# Patient Record
Sex: Male | Born: 2000
Health system: Southern US, Community
[De-identification: ages and names within clinical notes are randomized; demographics above are authoritative.]

## PROBLEM LIST (undated history)

## (undated) DIAGNOSIS — E039 Hypothyroidism, unspecified: Secondary | ICD-10-CM

---
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
## 2018-01-07 DIAGNOSIS — E039 Hypothyroidism, unspecified: Secondary | ICD-10-CM | POA: Diagnosis not present

## 2018-01-07 DIAGNOSIS — Z6834 Body mass index (BMI) 34.0-34.9, adult: Secondary | ICD-10-CM | POA: Diagnosis not present

## 2018-01-07 DIAGNOSIS — E663 Overweight: Secondary | ICD-10-CM | POA: Diagnosis not present

## 2018-01-07 DIAGNOSIS — R739 Hyperglycemia, unspecified: Secondary | ICD-10-CM | POA: Diagnosis not present

## 2018-03-01 DIAGNOSIS — B354 Tinea corporis: Secondary | ICD-10-CM | POA: Diagnosis not present

## 2018-03-15 DIAGNOSIS — B354 Tinea corporis: Secondary | ICD-10-CM | POA: Diagnosis not present

## 2018-03-15 DIAGNOSIS — Z6834 Body mass index (BMI) 34.0-34.9, adult: Secondary | ICD-10-CM | POA: Diagnosis not present

## 2018-04-11 DIAGNOSIS — E039 Hypothyroidism, unspecified: Secondary | ICD-10-CM | POA: Diagnosis not present

## 2018-04-19 DIAGNOSIS — Z6837 Body mass index (BMI) 37.0-37.9, adult: Secondary | ICD-10-CM | POA: Diagnosis not present

## 2018-04-19 DIAGNOSIS — E039 Hypothyroidism, unspecified: Secondary | ICD-10-CM | POA: Diagnosis not present

## 2018-08-16 DIAGNOSIS — H04123 Dry eye syndrome of bilateral lacrimal glands: Secondary | ICD-10-CM | POA: Diagnosis not present

## 2018-08-16 DIAGNOSIS — G245 Blepharospasm: Secondary | ICD-10-CM | POA: Diagnosis not present

## 2018-09-13 DIAGNOSIS — H04123 Dry eye syndrome of bilateral lacrimal glands: Secondary | ICD-10-CM | POA: Diagnosis not present

## 2018-09-13 DIAGNOSIS — G245 Blepharospasm: Secondary | ICD-10-CM | POA: Diagnosis not present

## 2018-11-13 DIAGNOSIS — L03031 Cellulitis of right toe: Secondary | ICD-10-CM | POA: Diagnosis not present

## 2018-11-13 DIAGNOSIS — L03032 Cellulitis of left toe: Secondary | ICD-10-CM | POA: Diagnosis not present

## 2018-11-13 DIAGNOSIS — S99921A Unspecified injury of right foot, initial encounter: Secondary | ICD-10-CM | POA: Diagnosis not present

## 2018-11-29 DIAGNOSIS — Z0000 Encounter for general adult medical examination without abnormal findings: Secondary | ICD-10-CM | POA: Diagnosis not present

## 2018-12-03 DIAGNOSIS — Z6837 Body mass index (BMI) 37.0-37.9, adult: Secondary | ICD-10-CM | POA: Diagnosis not present

## 2018-12-03 DIAGNOSIS — Z23 Encounter for immunization: Secondary | ICD-10-CM | POA: Diagnosis not present

## 2018-12-03 DIAGNOSIS — Z0000 Encounter for general adult medical examination without abnormal findings: Secondary | ICD-10-CM | POA: Diagnosis not present

## 2018-12-09 DIAGNOSIS — H5203 Hypermetropia, bilateral: Secondary | ICD-10-CM | POA: Diagnosis not present

## 2018-12-09 DIAGNOSIS — H04123 Dry eye syndrome of bilateral lacrimal glands: Secondary | ICD-10-CM | POA: Diagnosis not present

## 2018-12-09 DIAGNOSIS — G245 Blepharospasm: Secondary | ICD-10-CM | POA: Diagnosis not present

## 2019-01-13 DIAGNOSIS — F4010 Social phobia, unspecified: Secondary | ICD-10-CM | POA: Diagnosis not present

## 2019-02-10 ENCOUNTER — Other Ambulatory Visit: Payer: Self-pay

## 2019-02-10 DIAGNOSIS — E039 Hypothyroidism, unspecified: Secondary | ICD-10-CM | POA: Diagnosis not present

## 2019-02-10 DIAGNOSIS — Z20822 Contact with and (suspected) exposure to covid-19: Secondary | ICD-10-CM

## 2019-02-10 DIAGNOSIS — Z20828 Contact with and (suspected) exposure to other viral communicable diseases: Secondary | ICD-10-CM | POA: Diagnosis not present

## 2019-02-10 DIAGNOSIS — R509 Fever, unspecified: Secondary | ICD-10-CM | POA: Diagnosis not present

## 2019-02-12 DIAGNOSIS — R509 Fever, unspecified: Secondary | ICD-10-CM | POA: Diagnosis not present

## 2019-02-12 DIAGNOSIS — Z20828 Contact with and (suspected) exposure to other viral communicable diseases: Secondary | ICD-10-CM | POA: Diagnosis not present

## 2019-02-12 LAB — NOVEL CORONAVIRUS, NAA: SARS-CoV-2, NAA: NOT DETECTED

## 2019-03-24 DIAGNOSIS — Z719 Counseling, unspecified: Secondary | ICD-10-CM | POA: Diagnosis not present

## 2019-03-24 DIAGNOSIS — E039 Hypothyroidism, unspecified: Secondary | ICD-10-CM | POA: Diagnosis not present

## 2019-03-24 DIAGNOSIS — Z828 Family history of other disabilities and chronic diseases leading to disablement, not elsewhere classified: Secondary | ICD-10-CM | POA: Diagnosis not present

## 2019-04-01 DIAGNOSIS — J0300 Acute streptococcal tonsillitis, unspecified: Secondary | ICD-10-CM | POA: Diagnosis not present

## 2019-06-03 DIAGNOSIS — S0501XA Injury of conjunctiva and corneal abrasion without foreign body, right eye, initial encounter: Secondary | ICD-10-CM | POA: Diagnosis not present

## 2019-07-18 DIAGNOSIS — K080 Exfoliation of teeth due to systemic causes: Secondary | ICD-10-CM | POA: Diagnosis not present

## 2020-01-18 ENCOUNTER — Other Ambulatory Visit: Payer: Self-pay

## 2020-01-18 ENCOUNTER — Ambulatory Visit
Admission: EM | Admit: 2020-01-18 | Discharge: 2020-01-18 | Disposition: A | Discharge status: Home / Self Care | Payer: Self-pay

## 2020-01-18 DIAGNOSIS — L03011 Cellulitis of right finger: Secondary | ICD-10-CM

## 2020-01-18 MED ORDER — CEPHALEXIN 500 MG PO CAPS: 500.0000 mg | ORAL_CAPSULE | Freq: Four times a day (QID) | ORAL | 0 refills | Status: DC

## 2020-01-18 NOTE — Discharge Instructions (Signed)
Keflex as directed. Warm compresses. Keep wound clean and dry. Monitor for spreading redness, continue pus like drainage, fever, follow up for reevaluation.

## 2020-01-18 NOTE — ED Provider Notes (Signed)
[facility]    CSN: [id number] Arrival date & time: 01/18/20  1039      History   Chief Complaint Chief Complaint  Patient presents with   Hand Pain    right ring finger x 1 week    HPI Glenn Thompson is a 19 y.o. male.   19 year old male comes in for 1 week history of right ring finger pain, swelling, redness. States few days after this started, had purulent drainage out of the corner of nailbed. Since then has been applying otc medicine without relief. Continued swelling and pain and therefore came in for evaluation. Denies spreading erythema, fever.      History reviewed. No pertinent past medical history.  There are no problems to display for this patient.   History reviewed. No pertinent surgical history.     Home Medications    Prior to Admission medications   Medication Sig Start Date End Date Taking? Authorizing Provider  levothyroxine (SYNTHROID) 100 MCG tablet Take 50 mcg by mouth daily before breakfast.   Yes [provider]  cephALEXin (KEFLEX) 500 MG capsule Take 1 capsule (500 mg total) by mouth 4 (four) times daily. 01/18/20   Cathie Hoops, Dyami Umbach V, PA-C  dicyclomine (BENTYL) 20 MG tablet Take 1 tablet (20 mg total) by mouth 4 (four) times daily -  before meals and at bedtime. For 2 days 02/11/14 01/18/20  Truddie Coco, DO    Family History History reviewed. No pertinent family history.  Social History Social History   Tobacco Use   Smoking status: Never Smoker   Smokeless tobacco: Never Used  Building services engineer Use: Never used  Substance Use Topics   Alcohol use: Yes    Comment: socially   Drug use: Never     Allergies   Patient has no known allergies.   Review of Systems Review of Systems  Reason unable to perform ROS: See HPI as above.     Physical Exam Triage Vital Signs ED Triage Vitals  Enc Vitals Group     BP 01/18/20 1120 126/85     Pulse Rate 01/18/20 1120 95     Resp 01/18/20 1120 18      Temp 01/18/20 1120 98 F (36.7 C)     Temp Source 01/18/20 1120 Oral     SpO2 01/18/20 1120 98 %     Weight --      Height --      Head Circumference --      Peak Flow --      Pain Score 01/18/20 1122 4     Pain Loc --      Pain Edu? --      Excl. in GC? --    No data found.  Updated Vital Signs BP 126/85 (BP Location: Left Arm)    Pulse 95    Temp 98 F (36.7 C) (Oral)    Resp 18    SpO2 98%   Physical Exam Constitutional:      General: He is not in acute distress.    Appearance: Normal appearance. He is well-developed. He is not toxic-appearing or diaphoretic.  HENT:     Head: Normocephalic and atraumatic.  Eyes:     Conjunctiva/sclera: Conjunctivae normal.     Pupils: Pupils are equal, round, and reactive to light.  Pulmonary:     Effort: Pulmonary effort is normal. No respiratory distress.  Musculoskeletal:     Cervical back: Normal range of motion  and neck supple.     Comments: Right ring finger with crusting and swelling to the lateral nailbed. No active drainage. Surrounding erythema. Area tender to palpation. No obvious fluctuance felt. No finger pad tenderness. Full ROM of finger. NVI  Skin:    General: Skin is warm and dry.  Neurological:     Mental Status: He is alert and oriented to person, place, and time.      UC Treatments / Results  Labs (all labs ordered are listed, but only abnormal results are displayed) Labs Reviewed - No data to display  EKG   Radiology No results found.  Procedures Procedures (including critical care time)  Medications Ordered in UC Medications - No data to display  Initial Impression / Assessment and Plan / UC Course  I have reviewed the triage vital signs and the nursing notes.  Pertinent labs & imaging results that were available during my care of the patient were reviewed by me and considered in my medical decision making (see chart for details).    Keflex as directed. Wound care instructions given. Return  precautions given.  Final Clinical Impressions(s) / UC Diagnoses   Final diagnoses:  Paronychia of right ring finger   ED Prescriptions    Medication Sig Dispense Auth. Provider   cephALEXin (KEFLEX) 500 MG capsule Take 1 capsule (500 mg total) by mouth 4 (four) times daily. 28 capsule Belinda Fisher, PA-C     PDMP not reviewed this encounter.   Belinda Fisher, PA-C 01/18/20 1157

## 2020-01-18 NOTE — ED Triage Notes (Signed)
Pt states he bites his nails and the right ring finger became infected and painful approximately 1 week ago. Pt is aox4 and ambulatory.

## 2020-02-19 DIAGNOSIS — H04123 Dry eye syndrome of bilateral lacrimal glands: Secondary | ICD-10-CM | POA: Diagnosis not present

## 2020-02-19 DIAGNOSIS — S0501XS Injury of conjunctiva and corneal abrasion without foreign body, right eye, sequela: Secondary | ICD-10-CM | POA: Diagnosis not present

## 2020-02-19 DIAGNOSIS — G245 Blepharospasm: Secondary | ICD-10-CM | POA: Diagnosis not present

## 2020-10-07 DIAGNOSIS — E039 Hypothyroidism, unspecified: Secondary | ICD-10-CM | POA: Diagnosis not present

## 2020-10-07 DIAGNOSIS — R5383 Other fatigue: Secondary | ICD-10-CM | POA: Diagnosis not present

## 2020-10-17 ENCOUNTER — Other Ambulatory Visit: Payer: Self-pay

## 2020-10-17 ENCOUNTER — Ambulatory Visit
Admission: RE | Admit: 2020-10-17 | Discharge: 2020-10-17 | Disposition: A | Discharge status: Home / Self Care | Payer: Self-pay | Source: Ambulatory Visit | Attending: Physician Assistant | Admitting: Physician Assistant

## 2020-10-17 VITALS — BP 122/82 | HR 89 | Temp 98.1°F | Resp 18

## 2020-10-17 DIAGNOSIS — S0501XA Injury of conjunctiva and corneal abrasion without foreign body, right eye, initial encounter: Secondary | ICD-10-CM

## 2020-10-17 HISTORY — DX: Hypothyroidism, unspecified: E03.9

## 2020-10-17 MED ORDER — ERYTHROMYCIN 5 MG/GM OP OINT: TOPICAL_OINTMENT | OPHTHALMIC | 0 refills | Status: DC

## 2020-10-17 NOTE — ED Provider Notes (Signed)
[facility]    CSN: [id number] Arrival date & time: 10/17/20  1146      History   Chief Complaint Chief Complaint  Patient presents with   right eye injury    HPI Glenn Thompson is a 20 y.o. male.   Patient presents today with several day history of right eye irritation.  Reports that his cat scratched him in his right eye and he has had drainage and irritation since that time.  He does report blurred vision but believes this is because of irritation and reports that he is able to see light and other objects but does not read fine print.  He reports an episode of similar symptoms in the past at which point he was prescribed antibiotic ointment and resolved.  He has not seen his ophthalmologist recently but can schedule an appointment.  He denies any headache, nausea, vomiting, ocular pain.  He does not wear contacts but does wear glasses.   Past Medical History:  Diagnosis Date   Hypothyroidism     There are no problems to display for this patient.   History reviewed. No pertinent surgical history.     Home Medications    Prior to Admission medications   Medication Sig Start Date End Date Taking? Authorizing Provider  erythromycin ophthalmic ointment Place a 1/2 inch ribbon of ointment into the lower eyelid. 10/17/20  Yes [place] Frieden, Noberto Retort, PA-C  levothyroxine (SYNTHROID) 100 MCG tablet Take 50 mcg by mouth daily before breakfast.    [provider]  dicyclomine (BENTYL) 20 MG tablet Take 1 tablet (20 mg total) by mouth 4 (four) times daily -  before meals and at bedtime. For 2 days 02/11/14 01/18/20  Truddie Coco, DO    Family History History reviewed. No pertinent family history.  Social History Social History   Tobacco Use   Smoking status: Never   Smokeless tobacco: Never  Vaping Use   Vaping Use: Never used  Substance Use Topics   Alcohol use: Yes    Comment: socially   Drug use: Never     Allergies   Patient has no known  allergies.   Review of Systems Review of Systems  Constitutional:  Negative for activity change, appetite change, fatigue and fever.  Eyes:  Positive for photophobia, discharge, redness and visual disturbance. Negative for pain and itching.  Respiratory:  Negative for cough and shortness of breath.   Cardiovascular:  Negative for chest pain.  Gastrointestinal:  Negative for diarrhea, nausea and vomiting.  Neurological:  Negative for dizziness, light-headedness and headaches.    Physical Exam Triage Vital Signs ED Triage Vitals [10/17/20 1227]  Enc Vitals Group     BP 122/82     Pulse Rate 89     Resp 18     Temp 98.1 F (36.7 C)     Temp Source Oral     SpO2 96 %     Weight      Height      Head Circumference      Peak Flow      Pain Score 5     Pain Loc      Pain Edu?      Excl. in GC?    No data found.  Updated Vital Signs BP 122/82 (BP Location: Right Arm)   Pulse 89   Temp 98.1 F (36.7 C) (Oral)   Resp 18   SpO2 96%   Visual Acuity Right Eye Distance: 0/0 nothing Left  Eye Distance: 20/25 w/ glasses Bilateral Distance: 20/25 w/ glasses  Right Eye Near:   Left Eye Near:    Bilateral Near:     Physical Exam Vitals reviewed.  Constitutional:      General: He is awake.     Appearance: Normal appearance. He is normal weight. He is not ill-appearing.     Comments: Very pleasant male appears stated age in no acute distress sitting comfortably in exam room  HENT:     Head: Normocephalic and atraumatic.  Eyes:     Extraocular Movements: Extraocular movements intact.     Conjunctiva/sclera:     Right eye: Right conjunctiva is injected. No hemorrhage.    Left eye: Left conjunctiva is not injected. No hemorrhage.    Pupils: Pupils are equal, round, and reactive to light.     Right eye: Corneal abrasion present. No fluorescein uptake.  Cardiovascular:     Rate and Rhythm: Normal rate and regular rhythm.     Heart sounds: No murmur heard. Pulmonary:      Effort: Pulmonary effort is normal.     Breath sounds: Normal breath sounds. No stridor. No wheezing, rhonchi or rales.  Neurological:     Mental Status: He is alert.  Psychiatric:        Behavior: Behavior is cooperative.     UC Treatments / Results  Labs (all labs ordered are listed, but only abnormal results are displayed) Labs Reviewed - No data to display  EKG   Radiology No results found.  Procedures Procedures (including critical care time)  Medications Ordered in UC Medications - No data to display  Initial Impression / Assessment and Plan / UC Course  I have reviewed the triage vital signs and the nursing notes.  Pertinent labs & imaging results that were available during my care of the patient were reviewed by me and considered in my medical decision making (see chart for details).      On visual acuity patient was unable to read eye chart but does report vision is intact but blurred due to severity of discharge and pain.  He was prescribed erythromycin ointment and instructed to follow-up with ophthalmologist tomorrow.  He does not wear contacts and was instructed not to wear contacts until symptoms resolve.  He can use artificial tears but should avoid any vasoconstricting medications in his eye.  Discussed that if he has any worsening symptoms including vision loss he needs to go to the emergency room.  Strict return precautions given to which patient expressed understanding.  Final Clinical Impressions(s) / UC Diagnoses   Final diagnoses:  Abrasion of right cornea, initial encounter     Discharge Instructions      Use erythromycin ointment 2 times daily.  This is very thick so can make your vision blurry immediately after applying this to your eye.  Do not wear any contacts or put anything else into your eye other than artificial tears.  Follow-up with ophthalmology tomorrow.  If you have any increased ocular pain or difficulty seeing you need to go to the  emergency room.     ED Prescriptions     Medication Sig Dispense Auth. Provider   erythromycin ophthalmic ointment Place a 1/2 inch ribbon of ointment into the lower eyelid. 3.5 g Reis Goga K, PA-C      PDMP not reviewed this encounter.   Jeani Hawking, PA-C 10/17/20 1259

## 2020-10-17 NOTE — ED Triage Notes (Signed)
P/t c/o cat scratching right eye last Friday. Appears moderate-severe red with clear fluid draining. States painful and itchy. Concerned for infection.

## 2020-10-17 NOTE — Discharge Instructions (Addendum)
Use erythromycin ointment 2 times daily.  This is very thick so can make your vision blurry immediately after applying this to your eye.  Do not wear any contacts or put anything else into your eye other than artificial tears.  Follow-up with ophthalmology tomorrow.  If you have any increased ocular pain or difficulty seeing you need to go to the emergency room.

## 2020-10-18 DIAGNOSIS — S0501XA Injury of conjunctiva and corneal abrasion without foreign body, right eye, initial encounter: Secondary | ICD-10-CM | POA: Diagnosis not present

## 2020-11-06 ENCOUNTER — Ambulatory Visit: Payer: Self-pay

## 2020-12-23 DIAGNOSIS — S0501XA Injury of conjunctiva and corneal abrasion without foreign body, right eye, initial encounter: Secondary | ICD-10-CM | POA: Diagnosis not present

## 2020-12-23 DIAGNOSIS — H04123 Dry eye syndrome of bilateral lacrimal glands: Secondary | ICD-10-CM | POA: Diagnosis not present

## 2021-01-10 DIAGNOSIS — E039 Hypothyroidism, unspecified: Secondary | ICD-10-CM | POA: Diagnosis not present

## 2021-01-10 DIAGNOSIS — E559 Vitamin D deficiency, unspecified: Secondary | ICD-10-CM | POA: Diagnosis not present

## 2021-01-10 DIAGNOSIS — R5383 Other fatigue: Secondary | ICD-10-CM | POA: Diagnosis not present

## 2021-05-09 ENCOUNTER — Telehealth: Payer: Federal, State, Local not specified - PPO | Admitting: Physician Assistant

## 2021-05-09 DIAGNOSIS — R112 Nausea with vomiting, unspecified: Secondary | ICD-10-CM | POA: Diagnosis not present

## 2021-05-09 MED ORDER — ONDANSETRON 4 MG PO TBDP: 4.0000 mg | ORAL_TABLET | Freq: Three times a day (TID) | ORAL | 0 refills | Status: AC | PRN

## 2021-05-09 NOTE — Progress Notes (Signed)

## 2022-03-07 DIAGNOSIS — E039 Hypothyroidism, unspecified: Secondary | ICD-10-CM | POA: Diagnosis not present

## 2022-03-07 DIAGNOSIS — Z0000 Encounter for general adult medical examination without abnormal findings: Secondary | ICD-10-CM | POA: Diagnosis not present

## 2022-03-07 DIAGNOSIS — Z23 Encounter for immunization: Secondary | ICD-10-CM | POA: Diagnosis not present

## 2022-03-07 DIAGNOSIS — Z113 Encounter for screening for infections with a predominantly sexual mode of transmission: Secondary | ICD-10-CM | POA: Diagnosis not present

## 2022-03-08 DIAGNOSIS — F331 Major depressive disorder, recurrent, moderate: Secondary | ICD-10-CM | POA: Diagnosis not present

## 2022-05-23 DIAGNOSIS — E559 Vitamin D deficiency, unspecified: Secondary | ICD-10-CM | POA: Diagnosis not present

## 2022-05-23 DIAGNOSIS — E039 Hypothyroidism, unspecified: Secondary | ICD-10-CM | POA: Diagnosis not present

## 2022-05-31 DIAGNOSIS — E559 Vitamin D deficiency, unspecified: Secondary | ICD-10-CM | POA: Diagnosis not present

## 2022-05-31 DIAGNOSIS — F329 Major depressive disorder, single episode, unspecified: Secondary | ICD-10-CM | POA: Diagnosis not present

## 2022-05-31 DIAGNOSIS — E039 Hypothyroidism, unspecified: Secondary | ICD-10-CM | POA: Diagnosis not present

## 2022-06-01 DIAGNOSIS — F331 Major depressive disorder, recurrent, moderate: Secondary | ICD-10-CM | POA: Diagnosis not present

## 2022-06-01 DIAGNOSIS — F988 Other specified behavioral and emotional disorders with onset usually occurring in childhood and adolescence: Secondary | ICD-10-CM | POA: Diagnosis not present

## 2022-06-01 DIAGNOSIS — G4700 Insomnia, unspecified: Secondary | ICD-10-CM | POA: Diagnosis not present

## 2022-06-01 DIAGNOSIS — F411 Generalized anxiety disorder: Secondary | ICD-10-CM | POA: Diagnosis not present

## 2022-08-08 DIAGNOSIS — F411 Generalized anxiety disorder: Secondary | ICD-10-CM | POA: Diagnosis not present

## 2022-12-14 DIAGNOSIS — H18831 Recurrent erosion of cornea, right eye: Secondary | ICD-10-CM | POA: Diagnosis not present

## 2022-12-14 DIAGNOSIS — H5203 Hypermetropia, bilateral: Secondary | ICD-10-CM | POA: Diagnosis not present

## 2022-12-14 DIAGNOSIS — H04123 Dry eye syndrome of bilateral lacrimal glands: Secondary | ICD-10-CM | POA: Diagnosis not present

## 2023-09-28 DIAGNOSIS — F411 Generalized anxiety disorder: Secondary | ICD-10-CM | POA: Diagnosis not present

## 2023-09-28 DIAGNOSIS — F988 Other specified behavioral and emotional disorders with onset usually occurring in childhood and adolescence: Secondary | ICD-10-CM | POA: Diagnosis not present

## 2023-09-28 DIAGNOSIS — E039 Hypothyroidism, unspecified: Secondary | ICD-10-CM | POA: Diagnosis not present

## 2023-09-28 DIAGNOSIS — G4700 Insomnia, unspecified: Secondary | ICD-10-CM | POA: Diagnosis not present

## 2023-10-10 DIAGNOSIS — F609 Personality disorder, unspecified: Secondary | ICD-10-CM | POA: Diagnosis not present

## 2023-10-10 DIAGNOSIS — F4320 Adjustment disorder, unspecified: Secondary | ICD-10-CM | POA: Diagnosis not present

## 2023-10-10 DIAGNOSIS — F341 Dysthymic disorder: Secondary | ICD-10-CM | POA: Diagnosis not present

## 2023-10-12 DIAGNOSIS — E039 Hypothyroidism, unspecified: Secondary | ICD-10-CM | POA: Diagnosis not present

## 2023-10-12 DIAGNOSIS — E559 Vitamin D deficiency, unspecified: Secondary | ICD-10-CM | POA: Diagnosis not present

## 2023-10-17 DIAGNOSIS — F609 Personality disorder, unspecified: Secondary | ICD-10-CM | POA: Diagnosis not present

## 2023-10-17 DIAGNOSIS — F4320 Adjustment disorder, unspecified: Secondary | ICD-10-CM | POA: Diagnosis not present

## 2023-10-17 DIAGNOSIS — E669 Obesity, unspecified: Secondary | ICD-10-CM | POA: Diagnosis not present

## 2023-10-17 DIAGNOSIS — F909 Attention-deficit hyperactivity disorder, unspecified type: Secondary | ICD-10-CM | POA: Diagnosis not present

## 2023-10-17 DIAGNOSIS — R739 Hyperglycemia, unspecified: Secondary | ICD-10-CM | POA: Diagnosis not present

## 2023-10-17 DIAGNOSIS — E039 Hypothyroidism, unspecified: Secondary | ICD-10-CM | POA: Diagnosis not present

## 2023-10-17 DIAGNOSIS — E559 Vitamin D deficiency, unspecified: Secondary | ICD-10-CM | POA: Diagnosis not present

## 2023-10-17 DIAGNOSIS — F341 Dysthymic disorder: Secondary | ICD-10-CM | POA: Diagnosis not present

## 2023-10-24 DIAGNOSIS — F341 Dysthymic disorder: Secondary | ICD-10-CM | POA: Diagnosis not present

## 2023-10-24 DIAGNOSIS — F609 Personality disorder, unspecified: Secondary | ICD-10-CM | POA: Diagnosis not present

## 2023-10-24 DIAGNOSIS — F4320 Adjustment disorder, unspecified: Secondary | ICD-10-CM | POA: Diagnosis not present

## 2023-10-24 DIAGNOSIS — F909 Attention-deficit hyperactivity disorder, unspecified type: Secondary | ICD-10-CM | POA: Diagnosis not present

## 2023-10-25 DIAGNOSIS — F331 Major depressive disorder, recurrent, moderate: Secondary | ICD-10-CM | POA: Diagnosis not present

## 2023-10-25 DIAGNOSIS — F900 Attention-deficit hyperactivity disorder, predominantly inattentive type: Secondary | ICD-10-CM | POA: Diagnosis not present

## 2023-10-26 DIAGNOSIS — Z6835 Body mass index (BMI) 35.0-35.9, adult: Secondary | ICD-10-CM | POA: Diagnosis not present

## 2023-10-26 DIAGNOSIS — E039 Hypothyroidism, unspecified: Secondary | ICD-10-CM | POA: Diagnosis not present

## 2023-10-26 DIAGNOSIS — Z79899 Other long term (current) drug therapy: Secondary | ICD-10-CM | POA: Diagnosis not present

## 2023-10-26 DIAGNOSIS — E559 Vitamin D deficiency, unspecified: Secondary | ICD-10-CM | POA: Diagnosis not present

## 2023-12-05 DIAGNOSIS — F900 Attention-deficit hyperactivity disorder, predominantly inattentive type: Secondary | ICD-10-CM | POA: Diagnosis not present

## 2023-12-05 DIAGNOSIS — F3342 Major depressive disorder, recurrent, in full remission: Secondary | ICD-10-CM | POA: Diagnosis not present
